# Patient Record
Sex: Male | Born: 1945 | Hispanic: Yes | Marital: Married | Smoking: Never smoker
Health system: Southern US, Community
[De-identification: ages and names within clinical notes are randomized; demographics above are authoritative.]

## PROBLEM LIST (undated history)

## (undated) DIAGNOSIS — Z87442 Personal history of urinary calculi: Secondary | ICD-10-CM

## (undated) DIAGNOSIS — N400 Enlarged prostate without lower urinary tract symptoms: Secondary | ICD-10-CM

## (undated) DIAGNOSIS — H269 Unspecified cataract: Secondary | ICD-10-CM

## (undated) DIAGNOSIS — H409 Unspecified glaucoma: Secondary | ICD-10-CM

## (undated) HISTORY — PX: KNEE ARTHROSCOPY: SHX127

## (undated) HISTORY — PX: VASECTOMY REVERSAL: SHX243

## (undated) HISTORY — PX: VASECTOMY: SHX75

## (undated) HISTORY — PX: TONSILLECTOMY: SUR1361

---

## 1998-11-11 ENCOUNTER — Other Ambulatory Visit: Admission: RE | Admit: 1998-11-11 | Discharge: 1998-11-11 | Payer: Self-pay | Admitting: Urology

## 2000-01-24 ENCOUNTER — Ambulatory Visit (HOSPITAL_COMMUNITY): Admission: RE | Admit: 2000-01-24 | Discharge: 2000-01-24 | Payer: Self-pay | Admitting: Ophthalmology

## 2000-01-24 ENCOUNTER — Encounter (INDEPENDENT_AMBULATORY_CARE_PROVIDER_SITE_OTHER): Payer: Self-pay | Admitting: Specialist

## 2003-05-21 ENCOUNTER — Ambulatory Visit (HOSPITAL_COMMUNITY): Admission: RE | Admit: 2003-05-21 | Discharge: 2003-05-21 | Payer: Self-pay | Admitting: Gastroenterology

## 2006-01-12 ENCOUNTER — Encounter (INDEPENDENT_AMBULATORY_CARE_PROVIDER_SITE_OTHER): Payer: Self-pay | Admitting: Specialist

## 2006-01-12 ENCOUNTER — Ambulatory Visit (HOSPITAL_BASED_OUTPATIENT_CLINIC_OR_DEPARTMENT_OTHER): Admission: RE | Admit: 2006-01-12 | Discharge: 2006-01-12 | Payer: Self-pay | Admitting: Urology

## 2007-02-15 ENCOUNTER — Ambulatory Visit (HOSPITAL_BASED_OUTPATIENT_CLINIC_OR_DEPARTMENT_OTHER): Admission: RE | Admit: 2007-02-15 | Discharge: 2007-02-15 | Payer: Self-pay | Admitting: Urology

## 2008-09-23 ENCOUNTER — Emergency Department (HOSPITAL_COMMUNITY): Admission: EM | Admit: 2008-09-23 | Discharge: 2008-09-24 | Payer: Self-pay | Admitting: Emergency Medicine

## 2010-02-02 ENCOUNTER — Ambulatory Visit (HOSPITAL_BASED_OUTPATIENT_CLINIC_OR_DEPARTMENT_OTHER): Admission: RE | Admit: 2010-02-02 | Discharge: 2010-02-02 | Payer: Self-pay | Admitting: Orthopedic Surgery

## 2010-03-19 IMAGING — CR DG HAND COMPLETE 3+V*L*
3 series · 3 of 3 positions shown · non-contrast
Comparison: None available.

CLINICAL DATA: Panda finger laceration.

LEFT HAND - COMPLETE 3+ VIEW

[x hand pa left]
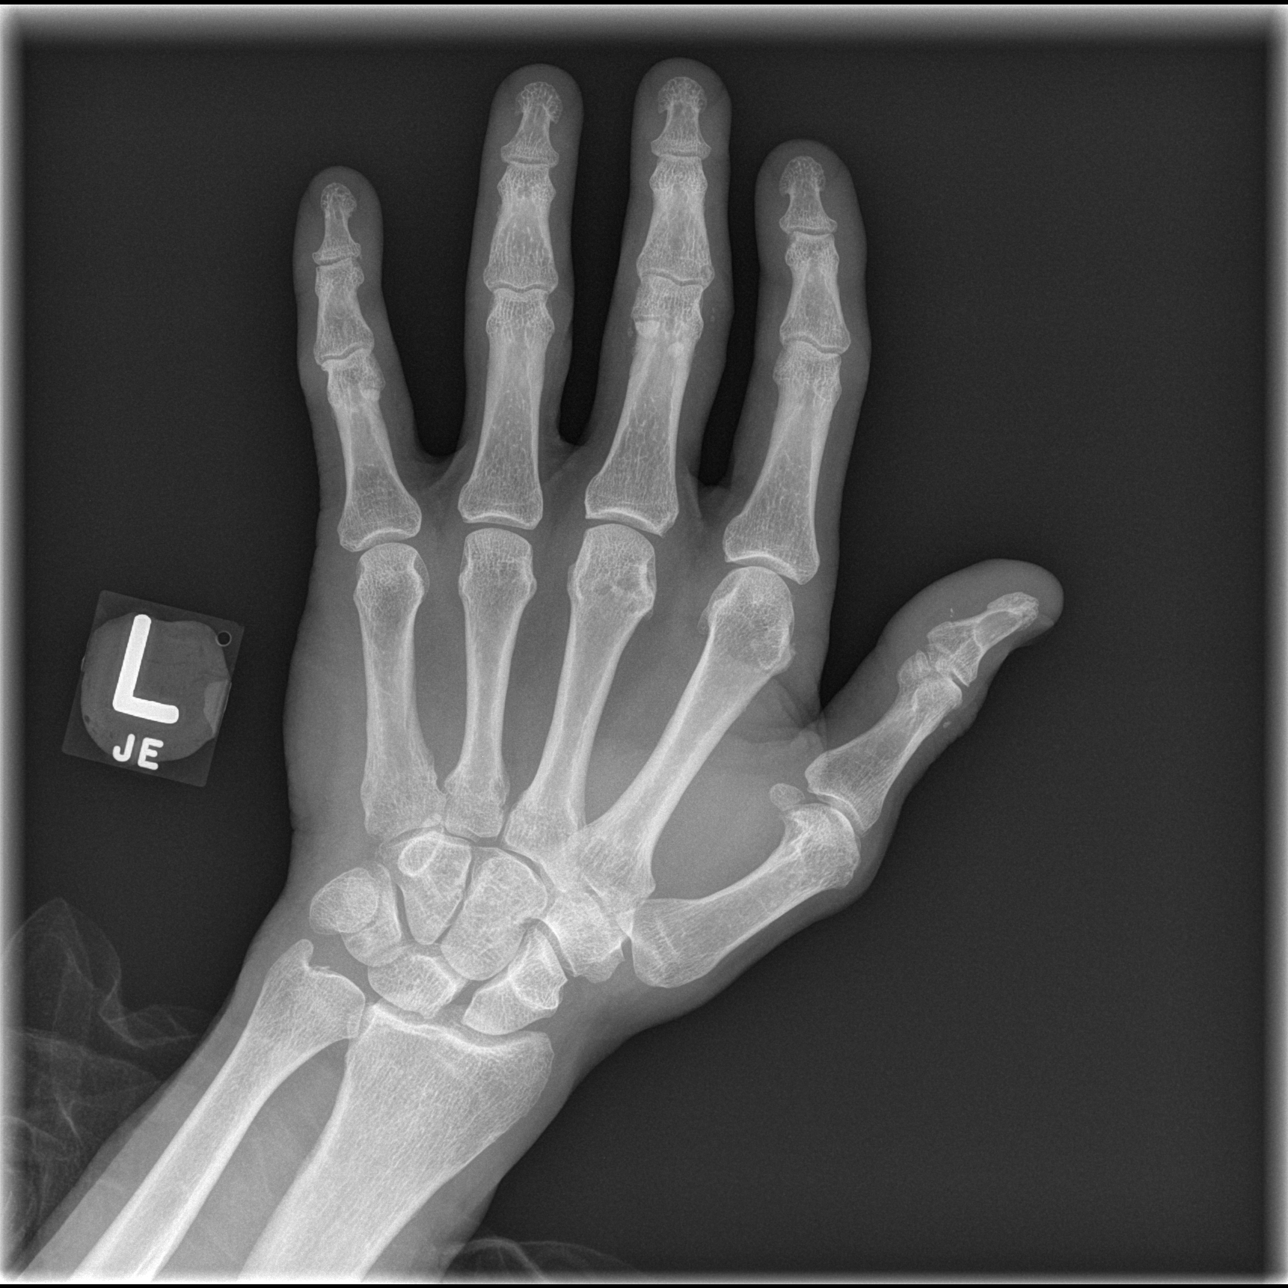

[x hand oblique left]
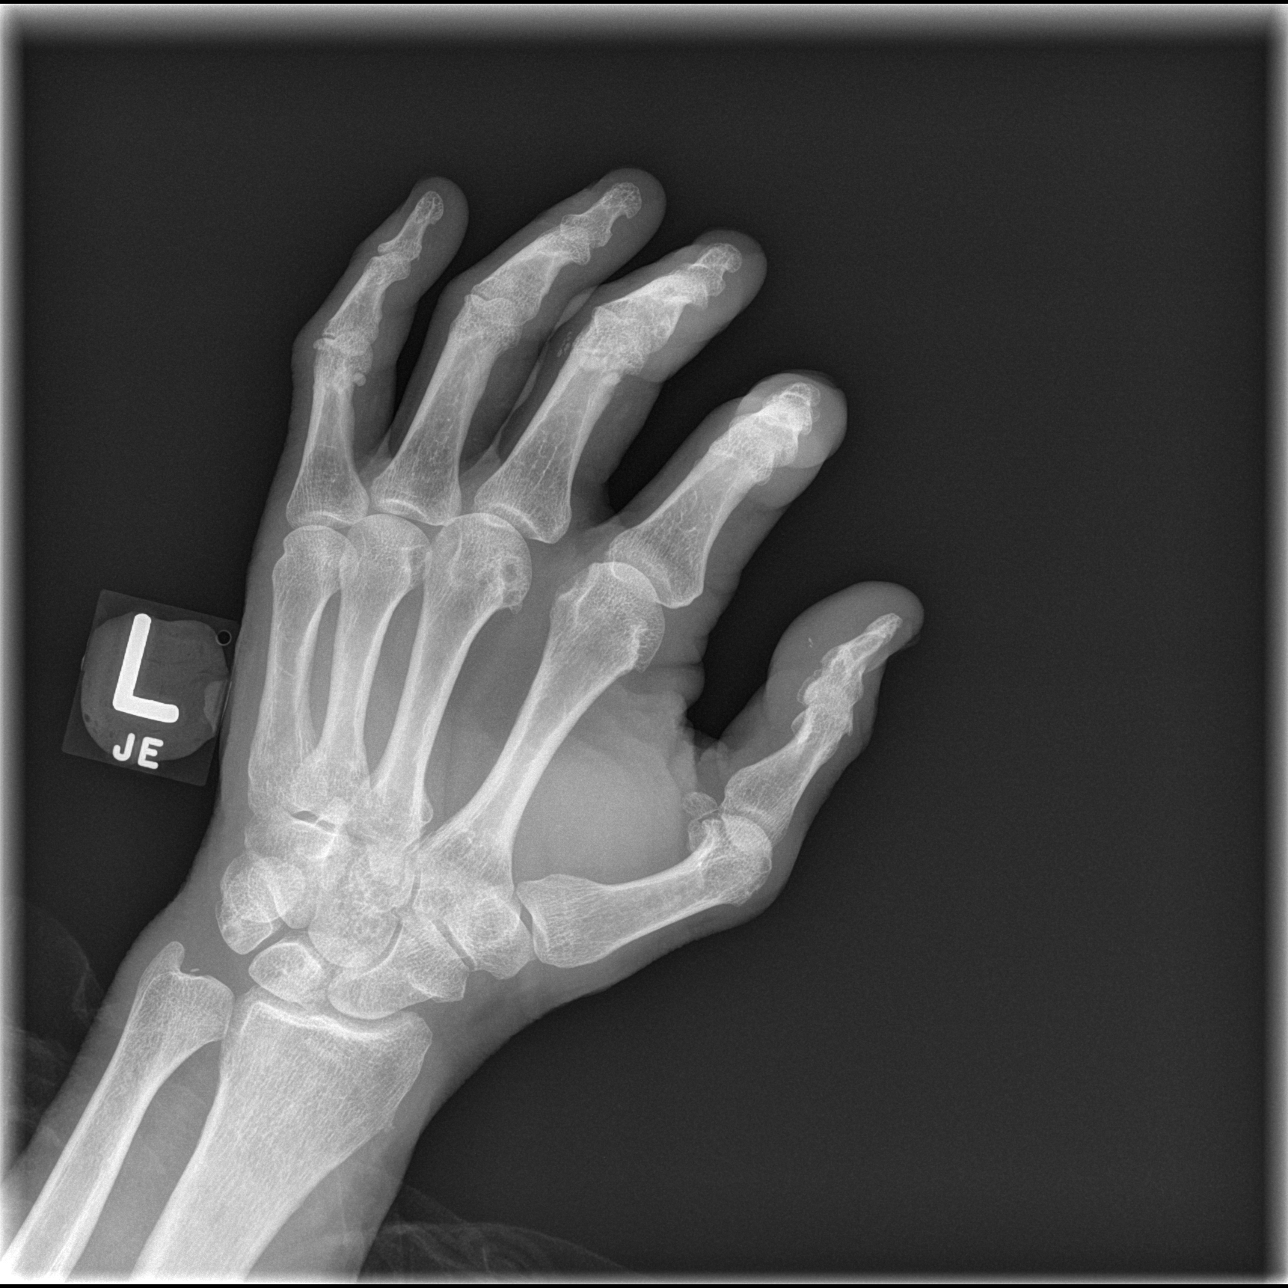

[x hand lat left]
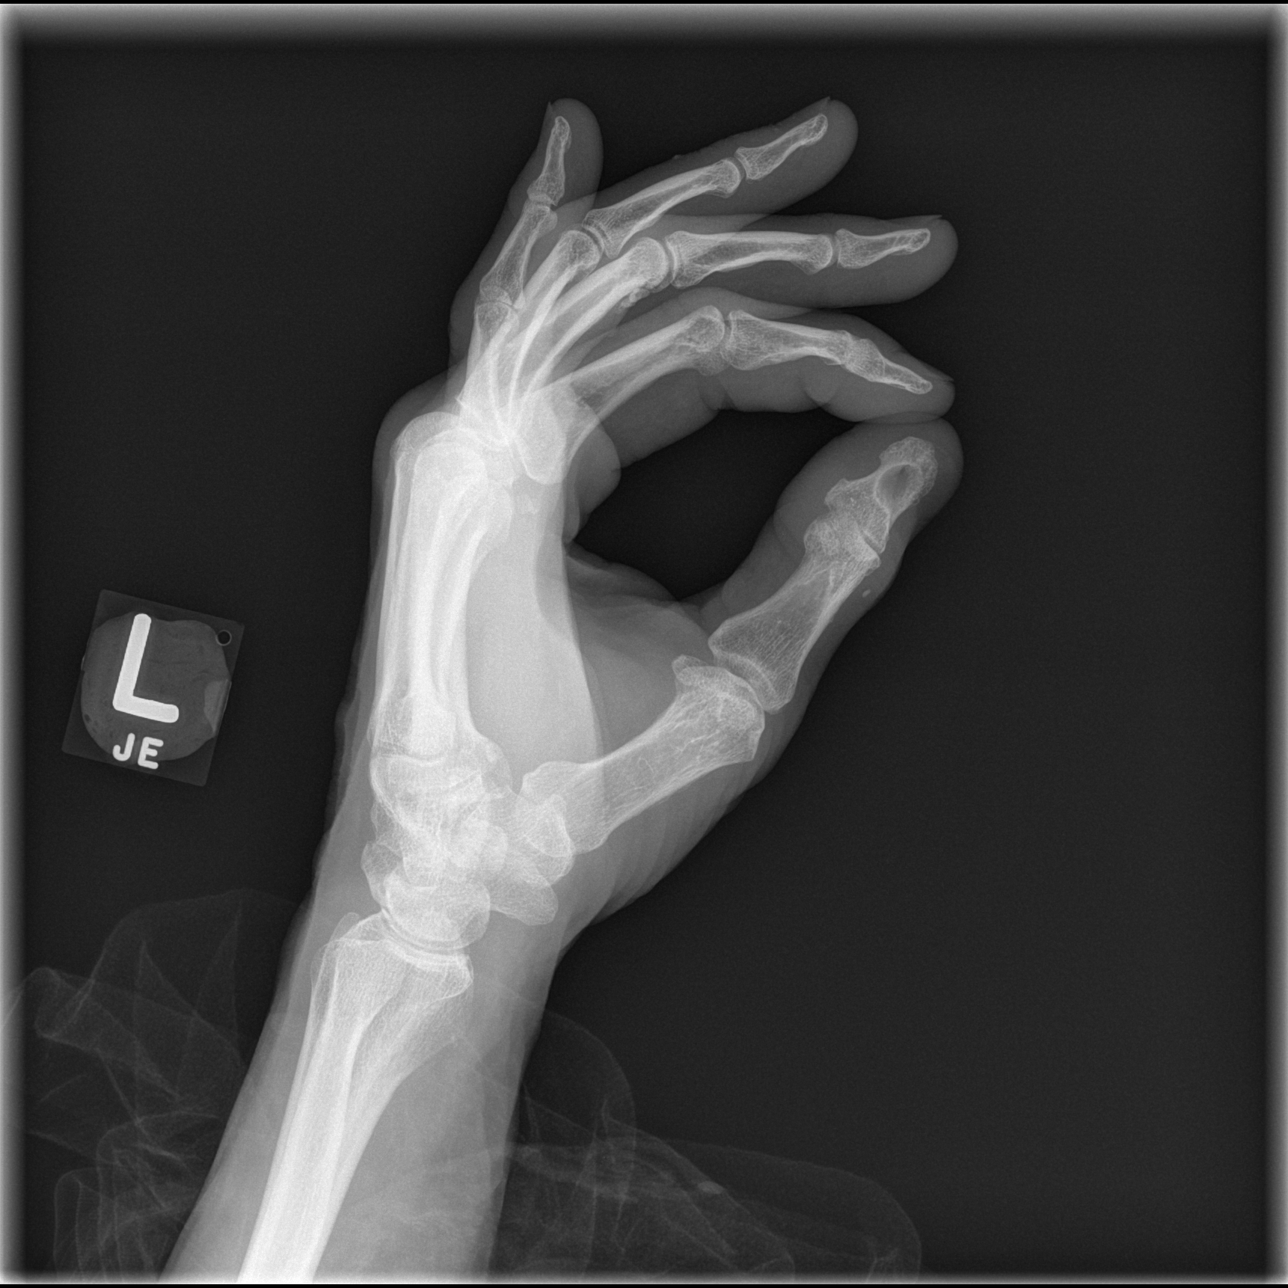

[3 of 3 positions shown; findings below may reference images not displayed]

FINDINGS: There is no acute bony or joint abnormality.  No
radiopaque foreign body is seen.  No marked degenerative change.
IMPRESSION: No acute finding.

## 2010-09-16 ENCOUNTER — Ambulatory Visit
Admission: RE | Admit: 2010-09-16 | Discharge: 2010-09-16 | Payer: Self-pay | Source: Home / Self Care | Attending: Orthopedic Surgery | Admitting: Orthopedic Surgery

## 2010-12-20 LAB — POCT HEMOGLOBIN-HEMACUE: Hemoglobin: 16 g/dL (ref 13.0–17.0)

## 2011-02-14 NOTE — Op Note (Signed)
NAME:  Connor Nunez, Connor Nunez            ACCOUNT NO.:  [id number]   MEDICAL RECORD NO.:  [id number]          PATIENT TYPE:  AMB   LOCATION:  [facility]                         FACILITY:  [facility]   PHYSICIAN:  Ronald L. Earlene Plater, M.D.  DATE OF BIRTH:  11/23/1945   DATE OF PROCEDURE:  02/15/2007  DATE OF DISCHARGE:                               OPERATIVE REPORT   DIAGNOSIS:  Gross hematuria.   PROCEDURE:  Cystourethroscopy.   SURGEON:  Gaynelle Arabian, M.D.   ANESTHESIA:  LMA.   BLOOD LOSS:  Negligible.   TUBE:  A 20-French Foley.   COMPLICATIONS:  None.   INDICATIONS FOR PROCEDURE:  [name] is a very nice 65 year old of male who  presented with gross intermittent hematuria on cystourethroscopy.  There  was a lesion posterior to the large median bar we felt needed to be  biopsied.  After understanding risks, benefits and alternatives he has  elected to proceed.  The lesion was actively bleeding.   DESCRIPTION OF PROCEDURE:  The patient was placed in supine position;  after proper LMA anesthesia, was placed in the dorsal lithotomy position  and prepped and draped with Betadine in a sterile fashion.  Cystourethroscopy was performed with a 22.5 [place] Olympus panendoscope.  He was noted to have significant trilobar hypertrophy with a very large  median bar.  The bladder had grade 1-2 trabeculation and there were no  lesions within it.  The ureteral orifices were difficult to visualize  but they appeared to be clear and in normal location and inspection  under the large median bar revealed no lesions, although the median bar  itself was quite oozy.  Flexible cystourethroscopy was then performed  and again I saw no specific lesions, although there were two tiny  bleeders on the median bar that were cauterized with Bugbee coagulation  cautery.  The flexible cystourethroscope was removed.  A 20-French Foley  catheter was inserted and the bladder was irrigated clear.   PLAN:  Take the Foley catheter out  in the morning.  I suspect bleeding  is from his enlarged prostate.  He will be placed on Avodart 0.5 mg p.o.  daily and we will follow him up as needed.      Ronald L. Earlene Plater, M.D.  Electronically Signed    RLD/MEDQ  D:  02/15/2007  T:  02/15/2007  Job:  [id number]

## 2011-02-17 NOTE — Op Note (Signed)
   NAME:  Connor Nunez, Connor Nunez                      ACCOUNT NO.:  [id number]   MEDICAL RECORD NO.:  [id number]                   PATIENT TYPE:  AMB   LOCATION:  ENDO                                 FACILITY:  [facility]   PHYSICIAN:  Bernette Redbird, M.D.                DATE OF BIRTH:  03-24-1946   DATE OF PROCEDURE:  05/21/2003  DATE OF DISCHARGE:                                 OPERATIVE REPORT   PROCEDURE:  Colonoscopy.   INDICATIONS:  This is a 65 year old referred by Dr. Arsenio Loader because of  heme-positive stool on one out of six hemoccult slides, with hemoccult  negative stool when I checked him in the office several days ago and with a  completely normal hemoglobin level, no worrisome symptoms, and no family  history of colorectal neoplasia.   FINDINGS:  Normal exam to the terminal ileum except melanosis coli.   CONSENT:  The nature, purpose and risks of this procedure have been  discussed with the patient who provided written consent.   SEDATION:  Fentanyl 50 mcg, Versed 6 mg IV without arrhythmias or  desaturation.   DESCRIPTION OF PROCEDURE:  Digital exam showed an enlarged but homogenous  prostate gland.   The Olympus pediatric adjustable tension video coloscope was advanced to the  terminal ileum and pull-back was then performed.   The patient did have melanosis coli but this was otherwise a normal  examination without evidence of polyps, cancer, colitis, or vascular  malformations.  Retroflexion in the rectum and reinspection of the  rectosigmoid was unremarkable.  The quality of the prep was quite good with  just a little bit of stool film that was irrigated here and there.  It is  not felt that any significant lesions would have been missed.   No biopsies were obtained. The patient tolerated the procedure well and  there were no apparent complications.   IMPRESSION:  Melanosis coli, otherwise normal examination.  Specifically, no  source of transient  hemoccult-positive stool identified.    PLAN:  Home hemoccults to verify that he is persistently heme-negative.  Consider EGD if they come back positive.                                                 Bernette Redbird, M.D.    RB/MEDQ  D:  05/21/2003  T:  05/21/2003  Job:  [id number]   cc:   Meredith Staggers, M.D.  [address]  [place]  [place] [address]  Fax: [phone]

## 2011-02-17 NOTE — Op Note (Signed)
NAME:  Connor Nunez, Connor Nunez            ACCOUNT NO.:  [id number]   MEDICAL RECORD NO.:  [id number]          PATIENT TYPE:  AMB   LOCATION:  [facility]                         FACILITY:  [facility]   PHYSICIAN:  Ronald L. Earlene Plater, M.D.  DATE OF BIRTH:  05-Jul-1946   DATE OF PROCEDURE:  01/12/2006  DATE OF DISCHARGE:                                 OPERATIVE REPORT   DIAGNOSIS:  Elevated prostate-specific antigen.   OPERATIVE PROCEDURE:  Transrectal ultrasound and biopsy of the prostate.   SURGEON:  Lucrezia Starch. Earlene Plater, M.D.   ANESTHESIA:  LMA.   ESTIMATED BLOOD LOSS:  Negligible.   TUBES:  None.   COMPLICATIONS:  None.   INDICATION FOR PROCEDURE:  Mr. Lohr is a very nice 65 year old male who  has had a negative prostate biopsy in 2001, when his PSA was 11.05.  His PSA  has increased to 13.3.  He elected to proceed with a biopsy; however, in the  office he was so tender rectally that we could not perform the biopsy.  After understanding the risks, benefits, and alternatives, he elected to  proceed with it under anesthesia.   PROCEDURE IN DETAIL:  The patient was placed in the supine position.  Appropriate LMA anesthesia, he was placed in the dorsal lithotomy position  and prepped and draped with Betadine in a sterile fashion.  Utilizing the 7  MHz probe with axial and sagittal scans performed of the prostate and  seminal vesicles, the prostate was noted to be 122.6 mL utilizing volumetric  measurement.  There were a few central zone  calcifications and significant  transitional zone hypertrophy.  The capsule was intact, as was the seminal  vesicle and seminal vesicle-prostatic angle.  Utilizing the biopsy probe,  five biopsies were taken from the right side, five from the left.  The  locations were apex, mid and base laterally, midprostate and __________  medially, and submitted as right and left sides.  He was told to expect  blood in his urine, blood in his stool, blood in his ejaculate.  If he  gets  a temperature above 101 or difficulty urinating, he will call.  Otherwise,  he will call us next week for his biopsy results.      Ronald L. Earlene Plater, M.D.  Electronically Signed     RLD/MEDQ  D:  01/12/2006  T:  01/12/2006  Job:  [id number]

## 2013-07-30 ENCOUNTER — Encounter (HOSPITAL_BASED_OUTPATIENT_CLINIC_OR_DEPARTMENT_OTHER): Payer: Self-pay | Admitting: Emergency Medicine

## 2013-07-30 ENCOUNTER — Emergency Department (HOSPITAL_BASED_OUTPATIENT_CLINIC_OR_DEPARTMENT_OTHER)
Admission: EM | Admit: 2013-07-30 | Discharge: 2013-07-30 | Disposition: A | Discharge status: Home / Self Care | Payer: Worker's Compensation | Attending: Emergency Medicine | Admitting: Emergency Medicine

## 2013-07-30 DIAGNOSIS — Z880 Allergy status to penicillin: Secondary | ICD-10-CM | POA: Insufficient documentation

## 2013-07-30 DIAGNOSIS — Y92410 Unspecified street and highway as the place of occurrence of the external cause: Secondary | ICD-10-CM | POA: Insufficient documentation

## 2013-07-30 DIAGNOSIS — Y9389 Activity, other specified: Secondary | ICD-10-CM | POA: Insufficient documentation

## 2013-07-30 DIAGNOSIS — S139XXA Sprain of joints and ligaments of unspecified parts of neck, initial encounter: Secondary | ICD-10-CM | POA: Insufficient documentation

## 2013-07-30 DIAGNOSIS — S161XXA Strain of muscle, fascia and tendon at neck level, initial encounter: Secondary | ICD-10-CM

## 2013-07-30 MED ORDER — METHOCARBAMOL 500 MG PO TABS: 500.0000 mg | ORAL_TABLET | Freq: Two times a day (BID) | ORAL | Status: AC
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
## 2017-07-18 ENCOUNTER — Other Ambulatory Visit: Payer: Self-pay | Admitting: Dermatology

## 2018-08-16 ENCOUNTER — Emergency Department (HOSPITAL_BASED_OUTPATIENT_CLINIC_OR_DEPARTMENT_OTHER): Payer: BLUE CROSS/BLUE SHIELD

## 2018-08-16 ENCOUNTER — Emergency Department (HOSPITAL_BASED_OUTPATIENT_CLINIC_OR_DEPARTMENT_OTHER)
Admission: EM | Admit: 2018-08-16 | Discharge: 2018-08-16 | Disposition: A | Discharge status: Home / Self Care | Payer: BLUE CROSS/BLUE SHIELD | Attending: Emergency Medicine | Admitting: Emergency Medicine

## 2018-08-16 ENCOUNTER — Encounter (HOSPITAL_BASED_OUTPATIENT_CLINIC_OR_DEPARTMENT_OTHER): Payer: Self-pay | Admitting: Emergency Medicine

## 2018-08-16 ENCOUNTER — Other Ambulatory Visit: Payer: Self-pay

## 2018-08-16 DIAGNOSIS — Z79899 Other long term (current) drug therapy: Secondary | ICD-10-CM | POA: Diagnosis not present

## 2018-08-16 DIAGNOSIS — R1032 Left lower quadrant pain: Secondary | ICD-10-CM | POA: Diagnosis present

## 2018-08-16 DIAGNOSIS — N23 Unspecified renal colic: Secondary | ICD-10-CM | POA: Diagnosis not present

## 2018-08-16 DIAGNOSIS — N200 Calculus of kidney: Secondary | ICD-10-CM

## 2018-08-16 HISTORY — DX: Unspecified cataract: H26.9

## 2018-08-16 LAB — CBC WITH DIFFERENTIAL/PLATELET
Abs Immature Granulocytes: 0.03 10*3/uL (ref 0.00–0.07)
BASOS PCT: 0 %
Basophils Absolute: 0 10*3/uL (ref 0.0–0.1)
Eosinophils Absolute: 0 10*3/uL (ref 0.0–0.5)
Eosinophils Relative: 0 %
HCT: 40.8 % (ref 39.0–52.0)
HEMOGLOBIN: 14.1 g/dL (ref 13.0–17.0)
Immature Granulocytes: 0 %
LYMPHS PCT: 8 %
Lymphs Abs: 0.5 10*3/uL — ABNORMAL LOW (ref 0.7–4.0)
MCH: 31.7 pg (ref 26.0–34.0)
MCHC: 34.6 g/dL (ref 30.0–36.0)
MCV: 91.7 fL (ref 80.0–100.0)
MONO ABS: 0.4 10*3/uL (ref 0.1–1.0)
Monocytes Relative: 6 %
NEUTROS PCT: 86 %
Neutro Abs: 5.7 10*3/uL (ref 1.7–7.7)
PLATELETS: 152 10*3/uL (ref 150–400)
RBC: 4.45 MIL/uL (ref 4.22–5.81)
RDW: 13.3 % (ref 11.5–15.5)
WBC: 6.7 10*3/uL (ref 4.0–10.5)
nRBC: 0 % (ref 0.0–0.2)

## 2018-08-16 LAB — URINALYSIS, ROUTINE W REFLEX MICROSCOPIC
Bilirubin Urine: NEGATIVE
GLUCOSE, UA: NEGATIVE mg/dL
HGB URINE DIPSTICK: NEGATIVE
Ketones, ur: NEGATIVE mg/dL
LEUKOCYTES UA: NEGATIVE
Nitrite: NEGATIVE
Protein, ur: NEGATIVE mg/dL
SPECIFIC GRAVITY, URINE: 1.02 (ref 1.005–1.030)
pH: 6 (ref 5.0–8.0)

## 2018-08-16 LAB — LIPASE, BLOOD: Lipase: 39 U/L (ref 11–51)

## 2018-08-16 LAB — COMPREHENSIVE METABOLIC PANEL
ALBUMIN: 4.1 g/dL (ref 3.5–5.0)
ALK PHOS: 57 U/L (ref 38–126)
ALT: 26 U/L (ref 0–44)
AST: 31 U/L (ref 15–41)
Anion gap: 10 (ref 5–15)
BUN: 19 mg/dL (ref 8–23)
CALCIUM: 9.6 mg/dL (ref 8.9–10.3)
CHLORIDE: 102 mmol/L (ref 98–111)
CO2: 26 mmol/L (ref 22–32)
CREATININE: 1.1 mg/dL (ref 0.61–1.24)
GFR calc non Af Amer: >60 mL/min (ref >60)
GLUCOSE: 123 mg/dL — AB (ref 70–99)
Potassium: 3.6 mmol/L (ref 3.5–5.1)
SODIUM: 138 mmol/L (ref 135–145)
Total Bilirubin: 0.9 mg/dL (ref 0.3–1.2)
Total Protein: 6.6 g/dL (ref 6.5–8.1)

## 2018-08-16 MED ORDER — FENTANYL CITRATE (PF) 100 MCG/2ML IJ SOLN
50.0000 ug | Freq: Once | INTRAMUSCULAR | Status: AC
  Administered 2018-08-16: 50 ug via INTRAVENOUS
  Filled 2018-08-16: qty 2

## 2018-08-16 MED ORDER — ONDANSETRON HCL 4 MG/2ML IJ SOLN
4.0000 mg | Freq: Once | INTRAMUSCULAR | Status: AC
  Administered 2018-08-16: 4 mg via INTRAVENOUS
  Filled 2018-08-16: qty 2

## 2018-08-16 MED ORDER — TAMSULOSIN HCL 0.4 MG PO CAPS: 0.4000 mg | ORAL_CAPSULE | Freq: Every day | ORAL | 0 refills | Status: AC

## 2018-08-16 MED ORDER — IBUPROFEN 600 MG PO TABS: 600.0000 mg | ORAL_TABLET | Freq: Four times a day (QID) | ORAL | 0 refills | Status: AC | PRN

## 2018-08-16 MED ORDER — OXYCODONE-ACETAMINOPHEN 5-325 MG PO TABS: 1.0000 | ORAL_TABLET | ORAL | 0 refills | Status: AC | PRN

## 2018-08-16 MED ORDER — SODIUM CHLORIDE 0.9 % IV SOLN
INTRAVENOUS | Status: DC | PRN
  Administered 2018-08-16: 07:00:00 via INTRAVENOUS

## 2018-08-16 MED ORDER — ONDANSETRON 4 MG PO TBDP: 4.0000 mg | ORAL_TABLET | Freq: Three times a day (TID) | ORAL | 0 refills | Status: AC | PRN

## 2018-08-16 NOTE — Discharge Instructions (Addendum)
Follow-up with the urologist.  Take the pain and nausea medication as prescribed.  You should also follow-up with urologist regarding your enlarged prostate.  Return to the ED with worsening pain, fever, vomiting, any other concerns.

## 2018-08-16 NOTE — ED Provider Notes (Signed)
[facility] EMERGENCY DEPARTMENT Provider Note   CSN: [id number] Arrival date & time: 08/16/18  0530     History   Chief Complaint Chief Complaint  Patient presents with  . Flank Pain    HPI Connor Nunez is a 72 y.o. male.  Patient with left-sided abdominal pain and flank pain since last evening.  Somewhat improved with ibuprofen.  Pain is in his left lower abdomen and is worse with palpation and feels like burning.  Had a similar pain about 1 month ago that resolved on its own.  He thinks this may be due to a kidney stone but he is never been diagnosed with such.  No pain with urination or blood in the urine.  No fever.  Did have one episode of vomiting.  No diarrhea or change in bowel habits.  No testicular pain.  No chest pain or shortness of breath.  No focal weakness, numbness or tingling.  No previous abdominal surgeries.  No testicular pain.  The history is provided by the patient.  Flank Pain  Associated symptoms include abdominal pain. Pertinent negatives include no chest pain, no headaches and no shortness of breath.    Past Medical History:  Diagnosis Date  . Cataract     There are no active problems to display for this patient.   Past Surgical History:  Procedure Laterality Date  . JOINT REPLACEMENT    . TONSILLECTOMY          Home Medications    Prior to Admission medications   Medication Sig Start Date End Date Taking? Authorizing Provider  methocarbamol (ROBAXIN) 500 MG tablet Take 1 tablet (500 mg total) by mouth 2 (two) times daily. 07/30/13   Lorre NickAllen, Anthony, MD    Family History No family history on file.  Social History Social History   Tobacco Use  . Smoking status: Never Smoker  Substance Use Topics  . Alcohol use: Yes    Comment: social  . Drug use: No     Allergies   Penicillins   Review of Systems Review of Systems  Constitutional: Negative for activity change, appetite change and fever.  HENT: Negative  for congestion.   Respiratory: Negative for chest tightness and shortness of breath.   Cardiovascular: Negative for chest pain.  Gastrointestinal: Positive for abdominal pain, nausea and vomiting. Negative for constipation and diarrhea.  Genitourinary: Positive for dysuria and flank pain. Negative for scrotal swelling and testicular pain.  Musculoskeletal: Negative for back pain, myalgias and neck pain.  Skin: Negative for rash.  Neurological: Negative for dizziness and headaches.    all other systems are negative except as noted in the HPI and PMH.    Physical Exam Updated Vital Signs BP (!) 162/73 (BP Location: Left Arm)   Pulse (!) 51   Temp 97.6 F (36.4 C) (Oral)   Resp 18   Ht 5\' 9"  (1.753 m)   Wt 77.1 kg   SpO2 99%   BMI 25.10 kg/m   Physical Exam  Constitutional: He is oriented to person, place, and time. He appears well-developed and well-nourished. No distress.  Very comfortable  HENT:  Head: Normocephalic and atraumatic.  Mouth/Throat: Oropharynx is clear and moist. No oropharyngeal exudate.  Eyes: Pupils are equal, round, and reactive to light. Conjunctivae and EOM are normal.  Neck: Normal range of motion. Neck supple.  No meningismus.  Cardiovascular: Normal rate, regular rhythm, normal heart sounds and intact distal pulses.  No murmur heard. Pulmonary/Chest: Effort normal  and breath sounds normal. No respiratory distress. He exhibits no tenderness.  Abdominal: Soft. There is tenderness. There is no rebound and no guarding.  LLQ abdominal pain. No guarding or rebound  Genitourinary:  Genitourinary Comments: No testicular tenderness  Musculoskeletal: Normal range of motion. He exhibits no edema or tenderness.  No CVAT  Neurological: He is alert and oriented to person, place, and time. No cranial nerve deficit. He exhibits normal muscle tone. Coordination normal.  No ataxia on finger to nose bilaterally. No pronator drift. 5/5 strength throughout. CN 2-12  intact.Equal grip strength. Sensation intact.   Skin: Skin is warm. Capillary refill takes less than 2 seconds. No rash noted.  Psychiatric: He has a normal mood and affect. His behavior is normal.  Nursing note and vitals reviewed.    ED Treatments / Results  Labs (all labs ordered are listed, but only abnormal results are displayed) Labs Reviewed  CBC WITH DIFFERENTIAL/PLATELET - Abnormal; Notable for the following components:      Result Value   Lymphs Abs 0.5 (*)    All other components within normal limits  COMPREHENSIVE METABOLIC PANEL - Abnormal; Notable for the following components:   Glucose, Bld 123 (*)    All other components within normal limits  URINALYSIS, ROUTINE W REFLEX MICROSCOPIC  LIPASE, BLOOD    EKG None  Radiology Ct Renal Stone Study  Result Date: 08/16/2018 CLINICAL DATA:  Acute onset of left flank and left lower quadrant abdominal pain. EXAM: CT ABDOMEN AND PELVIS WITHOUT CONTRAST TECHNIQUE: Multidetector CT imaging of the abdomen and pelvis was performed following the standard protocol without IV contrast. COMPARISON:  None. FINDINGS: Lower chest: The visualized lung bases are grossly clear. Scattered coronary artery calcifications are seen. Hepatobiliary: The liver is unremarkable in appearance. The gallbladder is unremarkable in appearance. The common bile duct remains normal in caliber. Pancreas: The pancreas is within normal limits. Spleen: The spleen is unremarkable in appearance. Adrenals/Urinary Tract: The adrenal glands are unremarkable in appearance. There is mild left-sided hydronephrosis, with left-sided perinephric stranding and fluid. An obstructing large 7 x 5 mm stone is noted at the proximal left ureter, 6 cm below the left renal pelvis. A nonobstructing 2 mm stone is noted at the lower pole of the right kidney. The right kidney is otherwise unremarkable in appearance. Stomach/Bowel: The stomach is unremarkable in appearance. The small bowel is  within normal limits. The appendix is normal in caliber, without evidence of appendicitis. The colon is unremarkable in appearance. Vascular/Lymphatic: The abdominal aorta is unremarkable in appearance. The inferior vena cava is grossly unremarkable. No retroperitoneal lymphadenopathy is seen. No pelvic sidewall lymphadenopathy is identified. Reproductive: The bladder is largely decompressed, with scattered small bladder stones seen. The prostate is significantly enlarged, with irregular lobulated impression on the base of the bladder, measuring 7.2 cm in transverse dimension. Other: No additional soft tissue abnormalities are seen. Musculoskeletal: No acute osseous abnormalities are identified. Multilevel disc space narrowing is noted along the lumbar spine. The visualized musculature is unremarkable in appearance. IMPRESSION: 1. Mild left-sided hydronephrosis, with an obstructing large 7 x 5 mm stone at the proximal left ureter, 6 cm below the left renal pelvis. 2. Nonobstructing 2 mm stone at the lower pole of the right kidney. 3. Significantly enlarged prostate, measuring 7.2 cm in transverse dimension, with irregular loculated impression on the base of the bladder. Malignancy cannot be excluded. Would correlate with PSA, and consider further imaging as deemed clinically appropriate. 4. Scattered coronary artery calcifications.  5. Mild degenerative change along the lumbar spine. Electronically Signed   By: Roanna Raider M.D.   On: 08/16/2018 06:27    Procedures Procedures (including critical care time)  Medications Ordered in ED Medications  fentaNYL (SUBLIMAZE) injection 50 mcg (has no administration in time range)  ondansetron (ZOFRAN) injection 4 mg (has no administration in time range)     Initial Impression / Assessment and Plan / ED Course  I have reviewed the triage vital signs and the nursing notes.  Pertinent labs & imaging results that were available during my care of the patient were  reviewed by me and considered in my medical decision making (see chart for details).    Left lower abdominal pain with nausea and vomiting.  No testicular pain.  Concern for possible kidney stone.  Urinalysis is negative.  Labs are reassuring.  No fever.  CT scan shows large proximal kidney stone with hydronephrosis.  Creatinine is normal.  Patient's pain and nausea remain well controlled in the ED.  D/w Dr. Vernie Ammons of urology.  Patient remains afebrile.  Urinalysis is negative.  He is tolerating p.o.  Dr. Vernie Ammons agrees that patient can follow-up as an outpatient along his pain and nausea are controlled and patient is afebrile without evidence of infectious symptoms.  Patient is aware of his enlarged prostate and already follows up with urology regarding this.  Discussed follow-up with urology.  Return to the ED with worsening pain, fever, vomiting, unable to urinate or other concerns. Final Clinical Impressions(s) / ED Diagnoses   Final diagnoses:  Kidney stone  Ureteral colic    ED Discharge Orders    None       Brailyn Killion, Jeannett Senior, MD 08/16/18 [phone]

## 2018-08-16 NOTE — ED Triage Notes (Signed)
Pt c/o left flank pain that radiates to left abd.

## 2018-08-27 ENCOUNTER — Other Ambulatory Visit: Payer: Self-pay | Admitting: Urology

## 2018-08-28 ENCOUNTER — Encounter (HOSPITAL_COMMUNITY): Payer: Self-pay | Admitting: General Practice

## 2018-09-04 NOTE — H&P (Signed)
I have ureteral stone.  HPI: Connor Nunez is a 72 year-old male established patient who is here for ureteral stone.  This patient c/o left flank pain approximately six weeks ago which resolved until 08/15/18. He experienced recurrence of left flank pain in combination with nausea and vomiting and went to the ER. CT Scan showed 7x5 mm proximal left ureteral stone. He was discharged with Zofran, Percocet #15, Flomax, and Ibuprofen.   08/26/18: Patient presents for f/u. He has not passed a stone. He continues to c/o left sided flank pain. He denies dysuria or gross hematuria. He denies fever, chills, nausea, or vomiting.   The problem is on the left side. He first stated noticing pain on approximately 07/02/2018. This is his first kidney stone.     ALLERGIES: Penicillins    MEDICATIONS: Finasteride 5 mg tablet 1 tablet PO Daily  Sildenafil Citrate 20 mg tablet 1 tablet PO Daily Take 1-5 tablets as needed for erectile dysfunction  Tamsulosin Hcl 0.4 mg capsule  B-Complex 400 mcg tablet 0 Oral  Eye Drops  Ibuprofen 600 mg tablet  Ibuprofen  Latanoprost  Multi Vitamin/Minerals TABS 1 Oral Daily  Oxycodone-Acetaminophen 5 mg-325 mg tablet  Sildenafil Citrate 20 mg tablet 2-5 tablet PO Daily As Directed  Sildenafil Citrate 20 mg tablet 3 tablet PO Daily As Directed  Simbrinza  Vitamin C 500 mg tablet 0 Oral  Vitamin D-3 TABS 0 Oral  Zofran 4 mg tablet  ZyrTEC Allergy TABS 0 Oral     GU PSH: Vasectomy - 2008      [facility] Notes: Knee Surgery Right, Surgery Of Male Genitalia Vasectomy   NON-GU PSH: None   GU PMH: Ureteral calculus - 08/16/2018 BPH w/LUTS - 12/06/2017, - 2018 ED due to arterial insufficiency - 12/06/2017, - 2018, Erectile dysfunction due to arterial insufficiency, - 2016 Nocturia - 2018 Bladder-neck stenosis/contracture, Bladder neck obstruction - 2016 Elevated PSA, Elevated prostate specific antigen (PSA) - 2016 BPH w/o LUTS, Benign localized prostatic hyperplasia  without lower urinary tract symptoms (LUTS) - 2014 Personal Hx Oth Urinary System diseases, History of hematuria - 2014      PMH Notes: Past Gu Hx with Dr. Rosana Hoes:    He takes Cialis occasionally with good results. He would like a new RX for 5 mg because he usually breaks the 20 mg into quarters.    He does have history of elevated PSA with most recent from outside clinic on 12/29/2009 being 8.47. Last PSA in 03/2009 was 10.58. He has history of prostate biospy in 2001 with PSA at 11. Second biopsy in 2007 when PSA at 13.    He has been on Avodart or finasteride now dating back to 2008. TRUSp in 2007 suggested 120 gram prostate.     NON-GU PMH: Encounter for general adult medical examination without abnormal findings, Encounter for preventive health examination - 2015 Glaucoma    FAMILY HISTORY: Diabetes - Father Family Health Status Number - Uncle Prostate Cancer - Uncle   SOCIAL HISTORY: Marital Status: Married Preferred Language: English; Race: White Current Smoking Status: Patient has never smoked.   Tobacco Use Assessment Completed: Used Tobacco in last 30 days? Drinks 1 caffeinated drink per day.     Notes: Never A Smoker, Tobacco Use, Alcohol Use, Marital History - Single, Caffeine Use, Death In The Family Father, Occupation:   REVIEW OF SYSTEMS:    GU Review Male:   Patient denies leakage of urine, have to strain to urinate , frequent urination, hard to  postpone urination, burning/ pain with urination, stream starts and stops, penile pain, erection problems, trouble starting your stream, and get up at night to urinate.  Gastrointestinal (Upper):   Patient denies nausea, vomiting, and indigestion/ heartburn.  Gastrointestinal (Lower):   Patient denies diarrhea and constipation.  Constitutional:   Patient denies fever, night sweats, weight loss, and fatigue.  Skin:   Patient denies skin rash/ lesion and itching.  Eyes:   Patient denies blurred vision and double vision.   Ears/ Nose/ Throat:   Patient denies sore throat and sinus problems.  Hematologic/Lymphatic:   Patient denies swollen glands and easy bruising.  Cardiovascular:   Patient denies leg swelling and chest pains.  Respiratory:   Patient denies cough and shortness of breath.  Endocrine:   Patient denies excessive thirst.  Musculoskeletal:   Patient denies back pain and joint pain.  Neurological:   Patient denies headaches and dizziness.  Psychologic:   Patient denies depression and anxiety.   VITAL SIGNS:      08/26/2018 03:38 PM  BP 137/83 mmHg  Heart Rate 58 /min  Temperature 97.7 F / 36.5 C   MULTI-SYSTEM PHYSICAL EXAMINATION:    Constitutional: Well-nourished. No physical deformities. Normally developed. Good grooming.  Neck: Neck symmetrical, not swollen. Normal tracheal position.  Respiratory: No labored breathing, no use of accessory muscles.   Neurologic / Psychiatric: Oriented to time, oriented to place, oriented to person. No depression, no anxiety, no agitation.  Gastrointestinal: No mass, no tenderness, no rigidity, non obese abdomen. NO CVAT.  Musculoskeletal: Normal gait and station of head and neck.     PAST DATA REVIEWED:  Source Of History:  Patient  X-Ray Review: C.T. Abdomen/Pelvis: Reviewed Films. Reviewed Report. Discussed With Patient.     12/06/17 12/04/16 08/30/11 02/06/11 01/21/07 07/27/06 07/07/03  PSA  Total PSA 8.20 ng/mL 8.27 ng/dl 7.93  10.05  17.21  13.84  9.20   Free PSA  2.47 ng/dl     2.53   % Free PSA  30 %     27.5     12/04/16  Hormones  Testosterone, Total 586.7 pg/dL    PROCEDURES:         KUB - 74018  A single view of the abdomen is obtained. 7 mm left ureteral calculus is apparent. No obvious calculi within expected renal shadows or within expected anatomical tract of right ureter.               Urinalysis w/Scope - 81001 Dipstick Dipstick Cont'd Micro  Color: Amber Bilirubin: Neg mg/dL WBC/hpf: NS (Not Seen)  Appearance: Turbid  Ketones: Neg mg/dL RBC/hpf: 40 - 60/hpf  Specific Gravity: 1.020 Blood: 3+ ery/uL Bacteria: NS (Not Seen)  pH: 6.5 Protein: 1+ mg/dL Cystals: NS (Not Seen)  Glucose: Neg mg/dL Urobilinogen: 0.2 mg/dL Casts: NS (Not Seen)    Nitrites: Neg Trichomonas: Not Present    Leukocyte Esterase: Trace leu/uL Mucous: Not Present      Epithelial Cells: NS (Not Seen)      Yeast: NS (Not Seen)      Sperm: Not Present    Notes: UNSPUN MICROSCOPIC          Urinalysis w/Scope - 81001 Dipstick Dipstick Cont'd Micro  Specimen: Voided Bilirubin: Neg WBC/hpf: NS (Not Seen)  Color: Amber Ketones: Neg RBC/hpf: 40-60/hpf  Appearance: Turbid Blood: 3+ Bacteria: NS (Not Seen)  Specific Gravity: 1.020 Protein: 1+ Cystals: NS (Not Seen)  pH: 6.5 Urobilinogen: 0.2 Casts: NS (Not Seen)  Glucose: Neg  Nitrites: Neg Trichomonas: Not Present    Leukocyte Esterase: Trace Mucous: Not Present      Epithelial Cells: NS (Not Seen)      Yeast: NS (Not Seen)      Sperm: Not Present    ASSESSMENT:      ICD-10 Details  1 GU:   Ureteral calculus - N20.1    PLAN:            Medications New Meds: Percocet 5 mg-325 mg tablet 1 tablet PO Q 6 H   #6  0 Refill(s)  Tamsulosin Hcl 0.4 mg capsule 1 capsule PO Daily   #30  0 Refill(s)            Orders Labs Urine Culture  X-Rays: KUB          Schedule         Document Letter(s):  Created for Patient: Clinical Summary         Notes:   This patient's left ureteral calculus persists. He continues to have intermittent left flank pain. He is requesting surgical intervention. We discussed ESWL versus ureteroscopy and he prefers ESWL. We will plan for ESWL. He understands that in light of the holidays this will most likely not be accomplished until next week. In the interim, I recommended he continue MET. However, he was instructed to notify the office if he experiences including but not limited to: intolerable pain, fever, chills, nausea, or vomiting.    After a thorough  review of the management options for the patient's condition the patient  elected to proceed with surgical therapy as noted above. We have discussed the potential benefits and risks of the procedure, side effects of the proposed treatment, the likelihood of the patient achieving the goals of the procedure, and any potential problems that might occur during the procedure or recuperation. Informed consent has been obtained.

## 2018-09-05 ENCOUNTER — Encounter (HOSPITAL_COMMUNITY): Payer: Self-pay | Admitting: General Practice

## 2018-09-05 ENCOUNTER — Ambulatory Visit (HOSPITAL_COMMUNITY): Payer: BLUE CROSS/BLUE SHIELD

## 2018-09-05 ENCOUNTER — Encounter (HOSPITAL_COMMUNITY)
Admission: RE | Disposition: A | Discharge status: Home / Self Care | Payer: Self-pay | Source: Ambulatory Visit | Attending: Urology

## 2018-09-05 ENCOUNTER — Ambulatory Visit (HOSPITAL_COMMUNITY)
Admission: RE | Admit: 2018-09-05 | Discharge: 2018-09-05 | Disposition: A | Discharge status: Home / Self Care | Payer: BLUE CROSS/BLUE SHIELD | Source: Ambulatory Visit | Attending: Urology | Admitting: Urology

## 2018-09-05 DIAGNOSIS — Z79899 Other long term (current) drug therapy: Secondary | ICD-10-CM | POA: Insufficient documentation

## 2018-09-05 DIAGNOSIS — N400 Enlarged prostate without lower urinary tract symptoms: Secondary | ICD-10-CM | POA: Diagnosis not present

## 2018-09-05 DIAGNOSIS — N201 Calculus of ureter: Secondary | ICD-10-CM | POA: Diagnosis present

## 2018-09-05 DIAGNOSIS — Z880 Allergy status to penicillin: Secondary | ICD-10-CM | POA: Insufficient documentation

## 2018-09-05 HISTORY — DX: Benign prostatic hyperplasia without lower urinary tract symptoms: N40.0

## 2018-09-05 HISTORY — DX: Personal history of urinary calculi: Z87.442

## 2018-09-05 HISTORY — PX: EXTRACORPOREAL SHOCK WAVE LITHOTRIPSY: SHX1557

## 2018-09-05 HISTORY — DX: Unspecified glaucoma: H40.9

## 2018-09-05 SURGERY — LITHOTRIPSY, ESWL
Anesthesia: LOCAL | Laterality: Left

## 2018-09-05 MED ORDER — DIPHENHYDRAMINE HCL 25 MG PO CAPS
25.0000 mg | ORAL_CAPSULE | ORAL | Status: AC
  Administered 2018-09-05: 25 mg via ORAL
  Filled 2018-09-05: qty 1

## 2018-09-05 MED ORDER — CIPROFLOXACIN HCL 500 MG PO TABS
500.0000 mg | ORAL_TABLET | ORAL | Status: AC
  Administered 2018-09-05: 500 mg via ORAL
  Filled 2018-09-05: qty 1

## 2018-09-05 MED ORDER — SODIUM CHLORIDE 0.9 % IV SOLN
INTRAVENOUS | Status: DC
  Administered 2018-09-05: 08:00:00 via INTRAVENOUS

## 2018-09-05 MED ORDER — DIAZEPAM 5 MG PO TABS
10.0000 mg | ORAL_TABLET | ORAL | Status: AC
  Administered 2018-09-05: 10 mg via ORAL
  Filled 2018-09-05: qty 2

## 2018-09-05 NOTE — Discharge Instructions (Signed)
I have reviewed discharge instructions in detail with the patient. They will follow-up with me or their physician as scheduled. My nurse will also be calling the patients as per protocol.   

## 2018-09-05 NOTE — Interval H&P Note (Signed)
History and Physical Interval Note:  09/05/2018 7:05 AM  Connor Nunez  has presented today for surgery, with the diagnosis of LEFT URETERAL STONE  The various methods of treatment have been discussed with the patient and family. After consideration of risks, benefits and other options for treatment, the patient has consented to  Procedure(s): EXTRACORPOREAL SHOCK WAVE LITHOTRIPSY (ESWL) (Left) as a surgical intervention .  The patient's history has been reviewed, patient examined, no change in status, stable for surgery.  I have reviewed the patient's chart and labs.  Questions were answered to the patient's satisfaction.     Jermario Kalmar A

## 2018-09-06 ENCOUNTER — Encounter (HOSPITAL_COMMUNITY): Payer: Self-pay | Admitting: Urology

## 2019-04-01 ENCOUNTER — Other Ambulatory Visit: Payer: Self-pay | Admitting: Urology

## 2019-04-02 ENCOUNTER — Encounter (HOSPITAL_COMMUNITY): Payer: Self-pay | Admitting: General Practice

## 2019-04-07 ENCOUNTER — Ambulatory Visit (HOSPITAL_COMMUNITY): Admission: RE | Admit: 2019-04-07 | Payer: BC Managed Care – PPO | Source: Home / Self Care | Admitting: Urology

## 2019-04-07 SURGERY — LITHOTRIPSY, ESWL
Anesthesia: LOCAL | Laterality: Left

## 2020-02-29 IMAGING — CR DG ABDOMEN 1V
2 series · 2 of 2 positions shown · non-contrast
Comparison: Radiograph and CT scan August 26, 2018.

CLINICAL DATA: Preop for lithotripsy.

EXAM:
ABDOMEN - 1 VIEW

[t abdomen supine (1 of 2)]
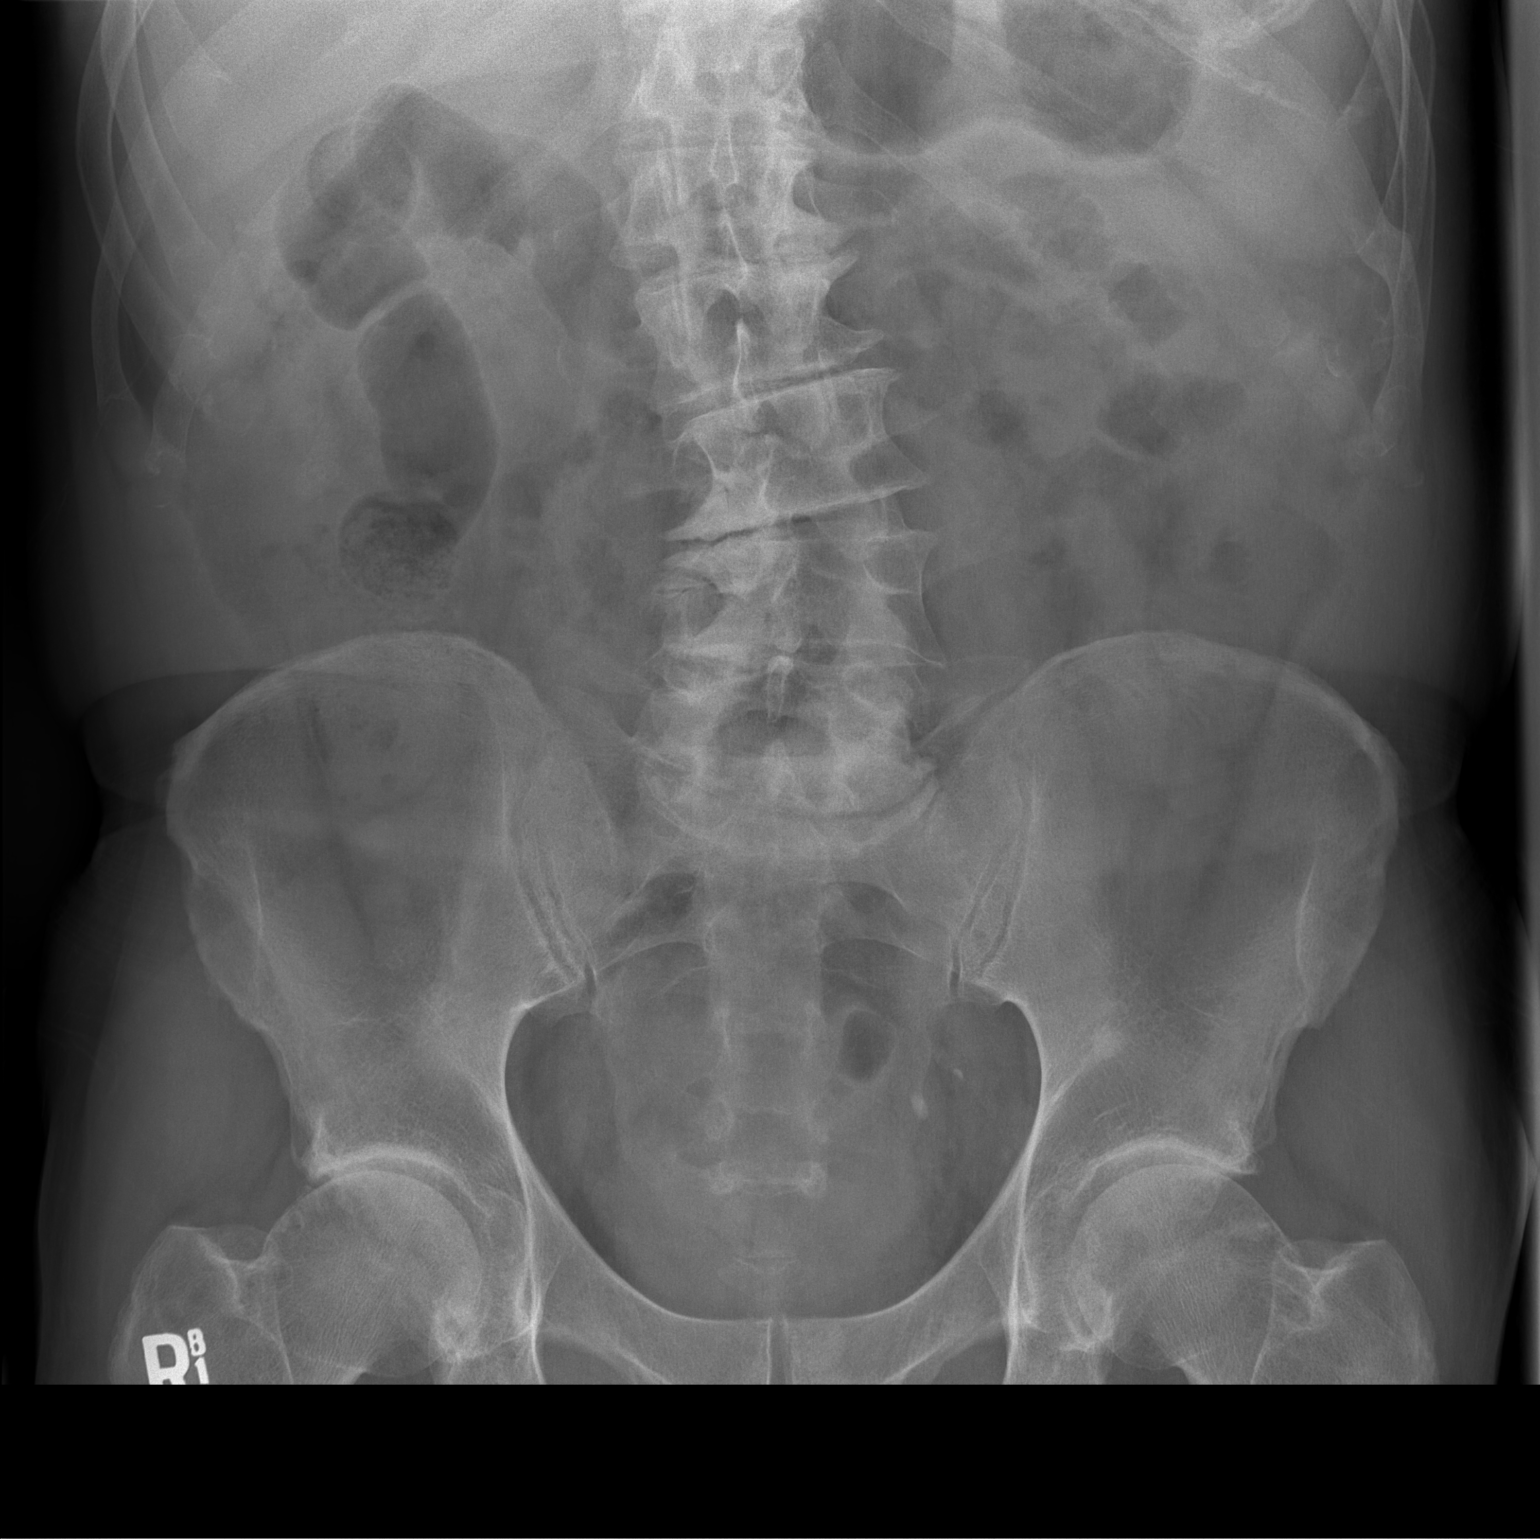

[t abdomen supine (2 of 2)]
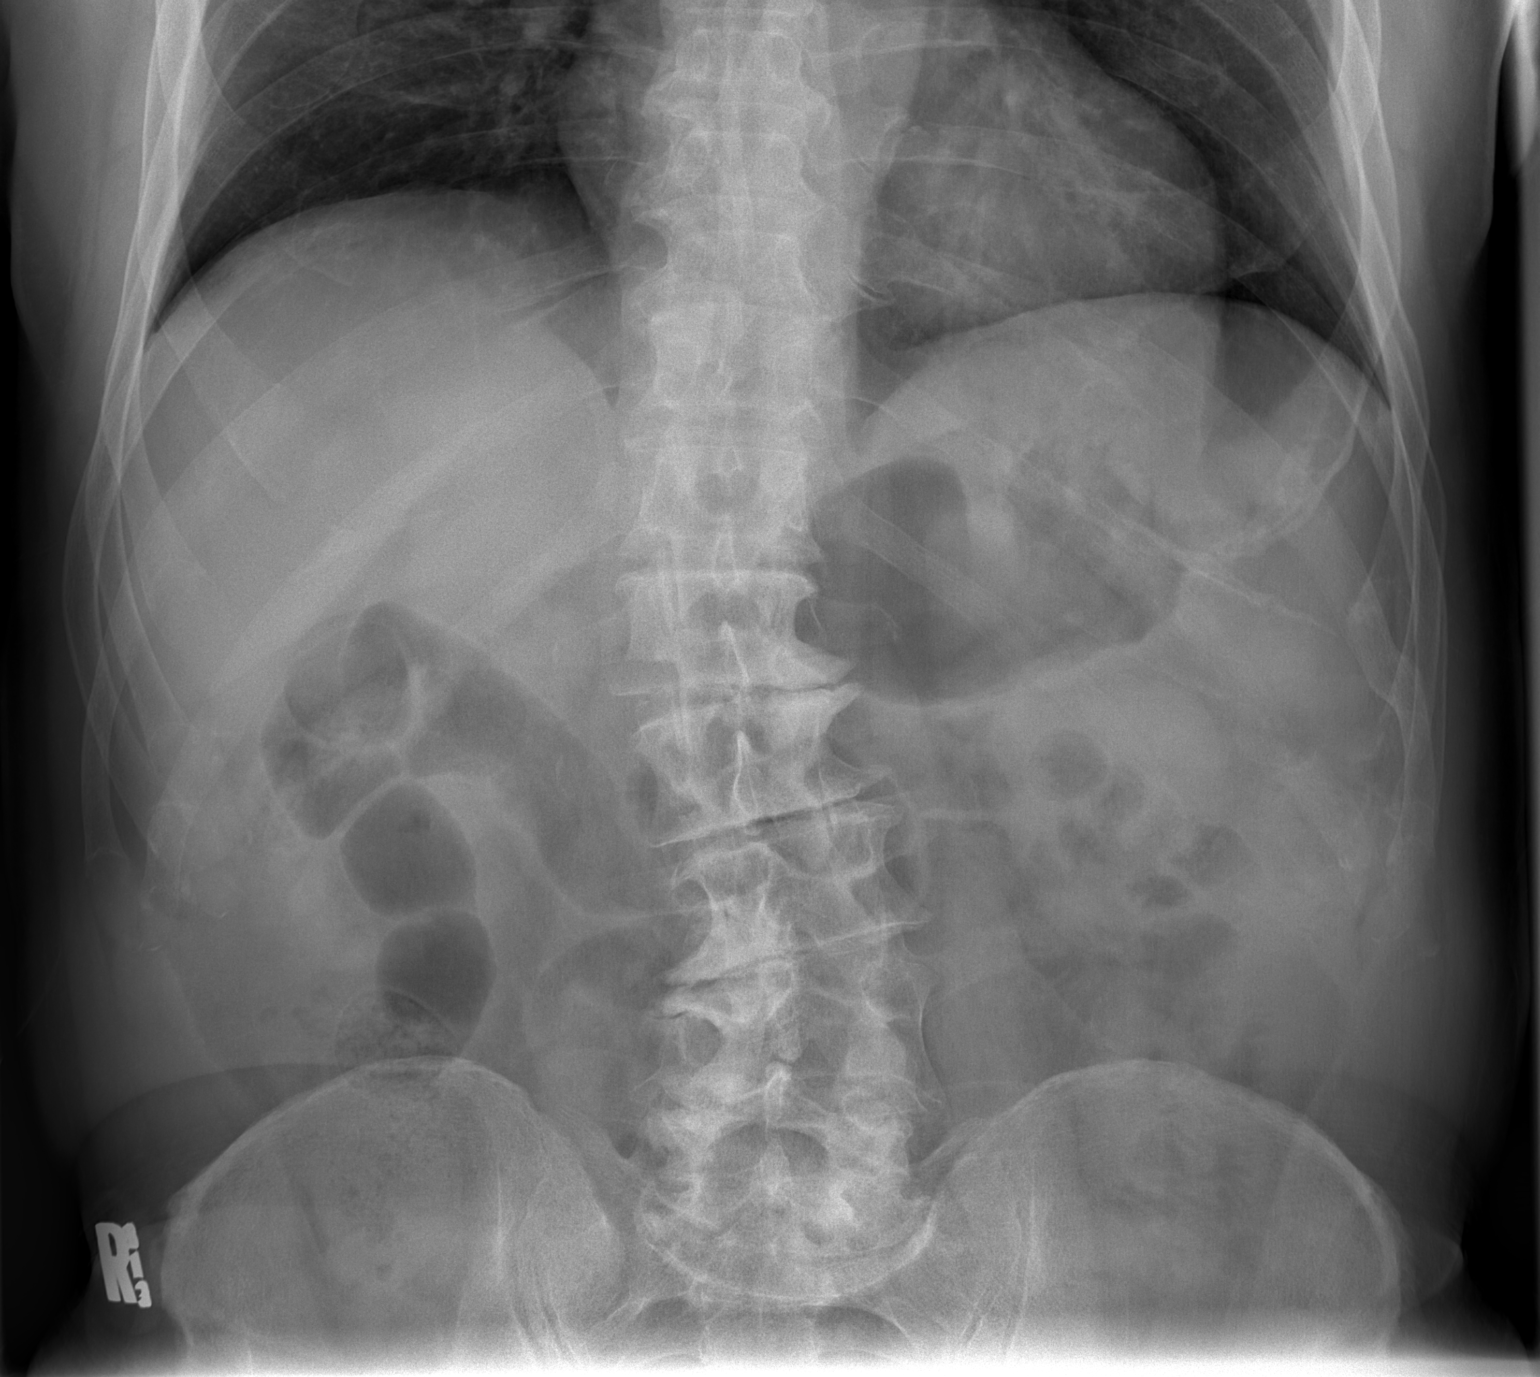

[2 of 2 positions shown; findings below may reference images not displayed]

FINDINGS: The bowel gas pattern is normal. Oval-shaped calcification is noted
in the left side of the pelvis which most likely corresponds to left
ureteral calculus identified on prior CT scan. It appears to have
moved slightly more distally compared to prior radiograph.
IMPRESSION: Probable distal left ureteral calculus is noted. No evidence of
bowel obstruction or ileus.

## 2023-01-10 ENCOUNTER — Other Ambulatory Visit: Payer: Self-pay

## 2023-01-10 DIAGNOSIS — R972 Elevated prostate specific antigen [PSA]: Secondary | ICD-10-CM

## 2023-01-20 ENCOUNTER — Other Ambulatory Visit: Payer: Self-pay | Admitting: Urology

## 2023-01-20 DIAGNOSIS — R972 Elevated prostate specific antigen [PSA]: Secondary | ICD-10-CM

## 2023-02-14 ENCOUNTER — Ambulatory Visit
Admission: RE | Admit: 2023-02-14 | Discharge: 2023-02-14 | Disposition: A | Discharge status: Home / Self Care | Payer: BC Managed Care – PPO | Source: Ambulatory Visit | Attending: Urology | Admitting: Urology

## 2023-02-14 ENCOUNTER — Other Ambulatory Visit: Payer: BC Managed Care – PPO

## 2023-02-14 DIAGNOSIS — R972 Elevated prostate specific antigen [PSA]: Secondary | ICD-10-CM

## 2023-02-14 MED ORDER — GADOPICLENOL 0.5 MMOL/ML IV SOLN
7.5000 mL | Freq: Once | INTRAVENOUS | Status: AC | PRN
  Administered 2023-02-14: 7.5 mL via INTRAVENOUS
